# Patient Record
Sex: Male | Born: 1965 | Race: White | Hispanic: No | Marital: Married
Health system: Southern US, Community
[De-identification: ages and names within clinical notes are randomized; demographics above are authoritative.]

---

## 2020-03-01 ENCOUNTER — Other Ambulatory Visit: Payer: Self-pay

## 2020-03-01 ENCOUNTER — Encounter: Payer: Self-pay | Admitting: Pulmonary Disease

## 2020-03-01 ENCOUNTER — Ambulatory Visit (INDEPENDENT_AMBULATORY_CARE_PROVIDER_SITE_OTHER): Payer: BC Managed Care – PPO | Admitting: Pulmonary Disease

## 2020-03-01 VITALS — BP 126/78 | HR 103 | Temp 97.5°F | Ht 77.0 in | Wt 317.8 lb

## 2020-03-01 DIAGNOSIS — R0683 Snoring: Secondary | ICD-10-CM | POA: Diagnosis not present

## 2020-03-01 NOTE — Progress Notes (Signed)
Zachary Herrera    [id number]    08-Apr-1966  Primary Care Physician:Patient, No Pcp Per  Referring Physician: Mattie Marlin, DO [address] [place],  [place] [address]  Chief complaint:   Patient with a history of snoring  HPI:  Long-term history of snoring Snoring is louder recently  Usually goes to bed between 1030 and 11 10 to 15 minutes to fall asleep Sometimes requires a sleep aid to help him sleep-uses NyQuil, did try melatonin in the past Multiple awakenings about 2-3 Final wake up time about 7 AM  Sleep is nonrestorative Usually with dryness in the morning No morning headaches No night sweats Memory is good Able to focus and concentrate without difficulty  Mom does snore and is on CPAP  Reformed smoker, quit in 2016   No outpatient encounter medications on file as of 03/01/2020.   No facility-administered encounter medications on file as of 03/01/2020.    Allergies as of 03/01/2020  . (Not on File)    No past medical history on file.  No family history on file.  Social History   Socioeconomic History  . Marital status: Married    Spouse name: Not on file  . Number of children: Not on file  . Years of education: Not on file  . Highest education level: Not on file  Occupational History  . Not on file  Tobacco Use  . Smoking status: Not on file  Substance and Sexual Activity  . Alcohol use: Not on file  . Drug use: Not on file  . Sexual activity: Not on file  Other Topics Concern  . Not on file  Social History Narrative  . Not on file   Social Determinants of Health   Financial Resource Strain:   . Difficulty of Paying Living Expenses: Not on file  Food Insecurity:   . Worried About Programme researcher, broadcasting/film/video in the Last Year: Not on file  . Ran Out of Food in the Last Year: Not on file  Transportation Needs:   . Lack of Transportation (Medical): Not on file  . Lack of Transportation (Non-Medical): Not on file  Physical  Activity:   . Days of Exercise per Week: Not on file  . Minutes of Exercise per Session: Not on file  Stress:   . Feeling of Stress : Not on file  Social Connections:   . Frequency of Communication with Friends and Family: Not on file  . Frequency of Social Gatherings with Friends and Family: Not on file  . Attends Religious Services: Not on file  . Active Member of Clubs or Organizations: Not on file  . Attends Banker Meetings: Not on file  . Marital Status: Not on file  Intimate Partner Violence:   . Fear of Current or Ex-Partner: Not on file  . Emotionally Abused: Not on file  . Physically Abused: Not on file  . Sexually Abused: Not on file    Review of Systems  Psychiatric/Behavioral: Positive for sleep disturbance.  All other systems reviewed and are negative.   There were no vitals filed for this visit.   Physical Exam Constitutional:      Appearance: He is obese.  HENT:     Nose: No congestion or rhinorrhea.     Mouth/Throat:     Pharynx: No oropharyngeal exudate.  Eyes:     General:        Right eye: No discharge.  Left eye: No discharge.  Musculoskeletal:     Cervical back: No rigidity or tenderness.  Neurological:     Mental Status: He is alert.    Data Reviewed: Referral documents reviewed  Assessment:  Moderate probability of significant obstructive sleep apnea  We will schedule patient for home sleep study  Treatment options discussed  Pathophysiology of sleep disordered breathing discussed   Plan/Recommendations: Risks with not treating sleep disordered breathing discussed  Tentative follow-up in 3 months  Encouraged to call with any significant concerns  Weight loss encouraged   Virl Diamond MD [facility] 03/01/2020, 8:43 AM  CC: Lazoff, Shawn P, DO

## 2020-03-01 NOTE — Patient Instructions (Signed)
Moderate probability of significant obstructive sleep apnea  We will schedule you for home sleep study We will update your results as soon as reviewed  Treatment options as we discussed  Follow-up tentatively in about 3 months  Call with significant concerns Sleep Apnea Sleep apnea affects breathing during sleep. It causes breathing to stop for a short time or to become shallow. It can also increase the risk of:  Heart attack.  Stroke.  Being very overweight (obese).  Diabetes.  Heart failure.  Irregular heartbeat. The goal of treatment is to help you breathe normally again. What are the causes? There are three kinds of sleep apnea:  Obstructive sleep apnea. This is caused by a blocked or collapsed airway.  Central sleep apnea. This happens when the brain does not send the right signals to the muscles that control breathing.  Mixed sleep apnea. This is a combination of obstructive and central sleep apnea. The most common cause of this condition is a collapsed or blocked airway. This can happen if:  Your throat muscles are too relaxed.  Your tongue and tonsils are too large.  You are overweight.  Your airway is too small. What increases the risk?  Being overweight.  Smoking.  Having a small airway.  Being older.  Being male.  Drinking alcohol.  Taking medicines to calm yourself (sedatives or tranquilizers).  Having family members with the condition. What are the signs or symptoms?  Trouble staying asleep.  Being sleepy or tired during the day.  Getting angry a lot.  Loud snoring.  Headaches in the morning.  Not being able to focus your mind (concentrate).  Forgetting things.  Less interest in sex.  Mood swings.  Personality changes.  Feelings of sadness (depression).  Waking up a lot during the night to pee (urinate).  Dry mouth.  Sore throat. How is this diagnosed?  Your medical history.  A physical exam.  A test that is  done when you are sleeping (sleep study). The test is most often done in a sleep lab but may also be done at home. How is this treated?   Sleeping on your side.  Using a medicine to get rid of mucus in your nose (decongestant).  Avoiding the use of alcohol, medicines to help you relax, or certain pain medicines (narcotics).  Losing weight, if needed.  Changing your diet.  Not smoking.  Using a machine to open your airway while you sleep, such as: ? An oral appliance. This is a mouthpiece that shifts your lower jaw forward. ? A CPAP device. This device blows air through a mask when you breathe out (exhale). ? An EPAP device. This has valves that you put in each nostril. ? A BPAP device. This device blows air through a mask when you breathe in (inhale) and breathe out.  Having surgery if other treatments do not work. It is important to get treatment for sleep apnea. Without treatment, it can lead to:  High blood pressure.  Coronary artery disease.  In men, not being able to have an erection (impotence).  Reduced thinking ability. Follow these instructions at home: Lifestyle  Make changes that your doctor recommends.  Eat a healthy diet.  Lose weight if needed.  Avoid alcohol, medicines to help you relax, and some pain medicines.  Do not use any products that contain nicotine or tobacco, such as cigarettes, e-cigarettes, and chewing tobacco. If you need help quitting, ask your doctor. General instructions  Take over-the-counter and prescription medicines  only as told by your doctor.  If you were given a machine to use while you sleep, use it only as told by your doctor.  If you are having surgery, make sure to tell your doctor you have sleep apnea. You may need to bring your device with you.  Keep all follow-up visits as told by your doctor. This is important. Contact a doctor if:  The machine that you were given to use during sleep bothers you or does not seem to  be working.  You do not get better.  You get worse. Get help right away if:  Your chest hurts.  You have trouble breathing in enough air.  You have an uncomfortable feeling in your back, arms, or stomach.  You have trouble talking.  One side of your body feels weak.  A part of your face is hanging down. These symptoms may be an emergency. Do not wait to see if the symptoms will go away. Get medical help right away. Call your local emergency services (911 in the [place] Do not drive yourself to the hospital. Summary  This condition affects breathing during sleep.  The most common cause is a collapsed or blocked airway.  The goal of treatment is to help you breathe normally while you sleep. This information is not intended to replace advice given to you by your health care provider. Make sure you discuss any questions you have with your health care provider. Document Revised: 01/10/2018 Document Reviewed: 11/19/2017 Elsevier Patient Education  [place].

## 2020-04-13 ENCOUNTER — Telehealth: Payer: Self-pay | Admitting: Pulmonary Disease

## 2020-04-13 DIAGNOSIS — R0683 Snoring: Secondary | ICD-10-CM

## 2020-04-13 NOTE — Telephone Encounter (Signed)
Office note indicates home sleep study  Will send to Dr. Wynona Neat for clarification

## 2020-04-13 NOTE — Telephone Encounter (Signed)
Pt calling to schedule HST but I do not see a order

## 2020-04-14 NOTE — Telephone Encounter (Signed)
Ordered placed

## 2020-04-14 NOTE — Telephone Encounter (Signed)
Yes, please kindly schedule home sleep study

## 2020-05-06 ENCOUNTER — Other Ambulatory Visit: Payer: Self-pay

## 2020-05-06 ENCOUNTER — Ambulatory Visit: Payer: BC Managed Care – PPO

## 2020-05-06 DIAGNOSIS — R0683 Snoring: Secondary | ICD-10-CM

## 2020-05-20 ENCOUNTER — Telehealth: Payer: Self-pay | Admitting: Pulmonary Disease

## 2020-05-20 DIAGNOSIS — R0683 Snoring: Secondary | ICD-10-CM

## 2020-05-20 NOTE — Telephone Encounter (Signed)
Called and spoke with patient to let him know of his negative sleep study results. Advised him of the recommendations for oral device he stated that his dentist can do them and he would think about it. Nothing further needed at this time.

## 2020-05-20 NOTE — Telephone Encounter (Signed)
Call patient  Sleep study result  Date of study: 05/08/2020  Impression: Negative study for significant sleep disordered breathing No oxygen desaturations Snoring  Recommendation: Referral to a dentist for an oral device for the treatment of snoring should be considered  Negative study may be a false negative especially in patients with high pretest probability-high likelihood of sleep apnea  A negative study is consistent with you not having significant obstructive sleep apnea  Routine follow-up as needed

## 2021-02-27 ENCOUNTER — Other Ambulatory Visit: Payer: Self-pay | Admitting: Allergy

## 2021-02-27 ENCOUNTER — Other Ambulatory Visit: Payer: Self-pay

## 2021-02-27 ENCOUNTER — Ambulatory Visit
Admission: RE | Admit: 2021-02-27 | Discharge: 2021-02-27 | Disposition: A | Discharge status: Home / Self Care | Payer: BC Managed Care – PPO | Source: Ambulatory Visit | Attending: Allergy | Admitting: Allergy

## 2021-02-27 DIAGNOSIS — R059 Cough, unspecified: Secondary | ICD-10-CM

## 2022-02-22 IMAGING — CR DG CHEST 2V
2 series · 2 of 2 positions shown · non-contrast
Comparison: None.

CLINICAL DATA: Cough and shortness of breath at night for several
months.

EXAM:
CHEST - 2 VIEW

[w chest pa]
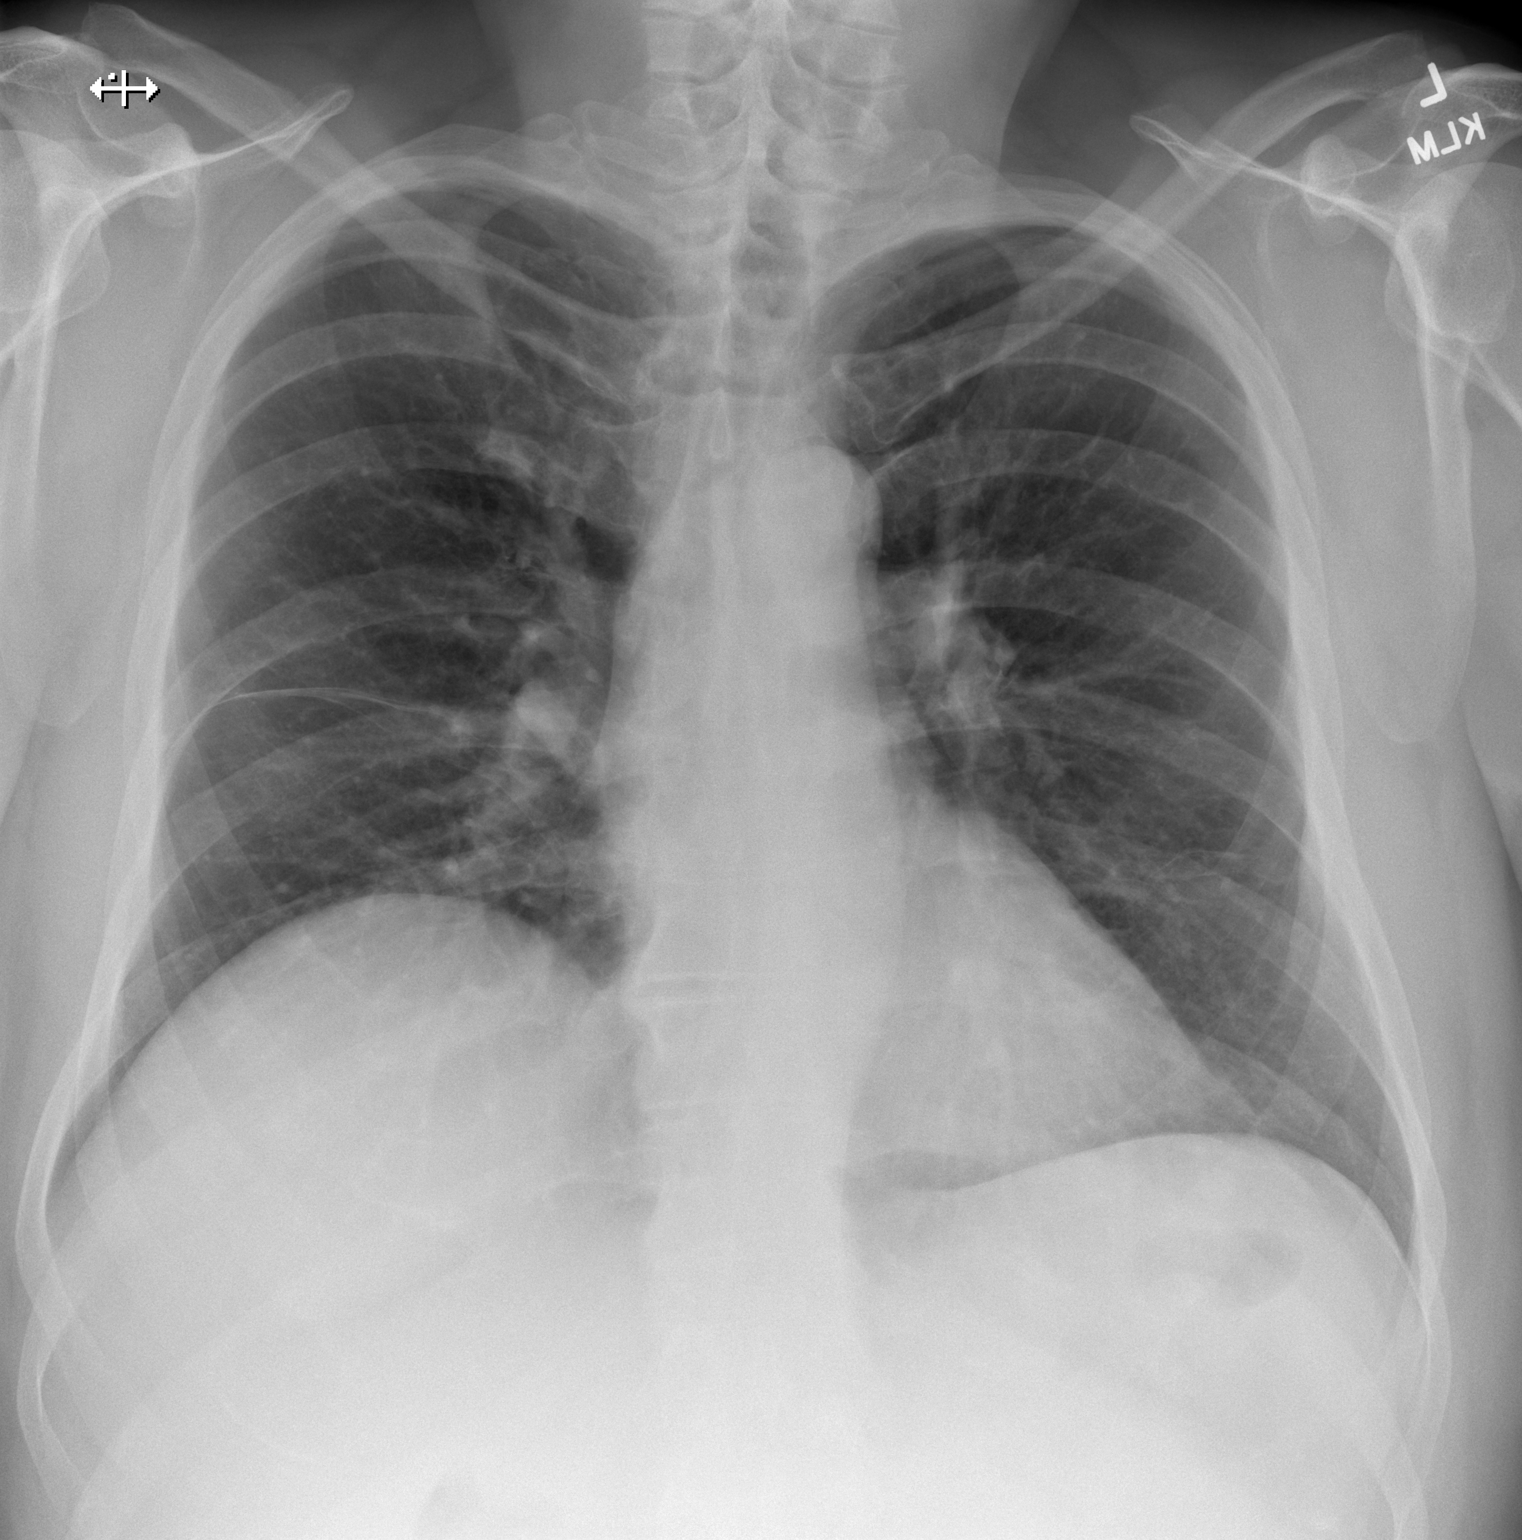

[w chest lat]
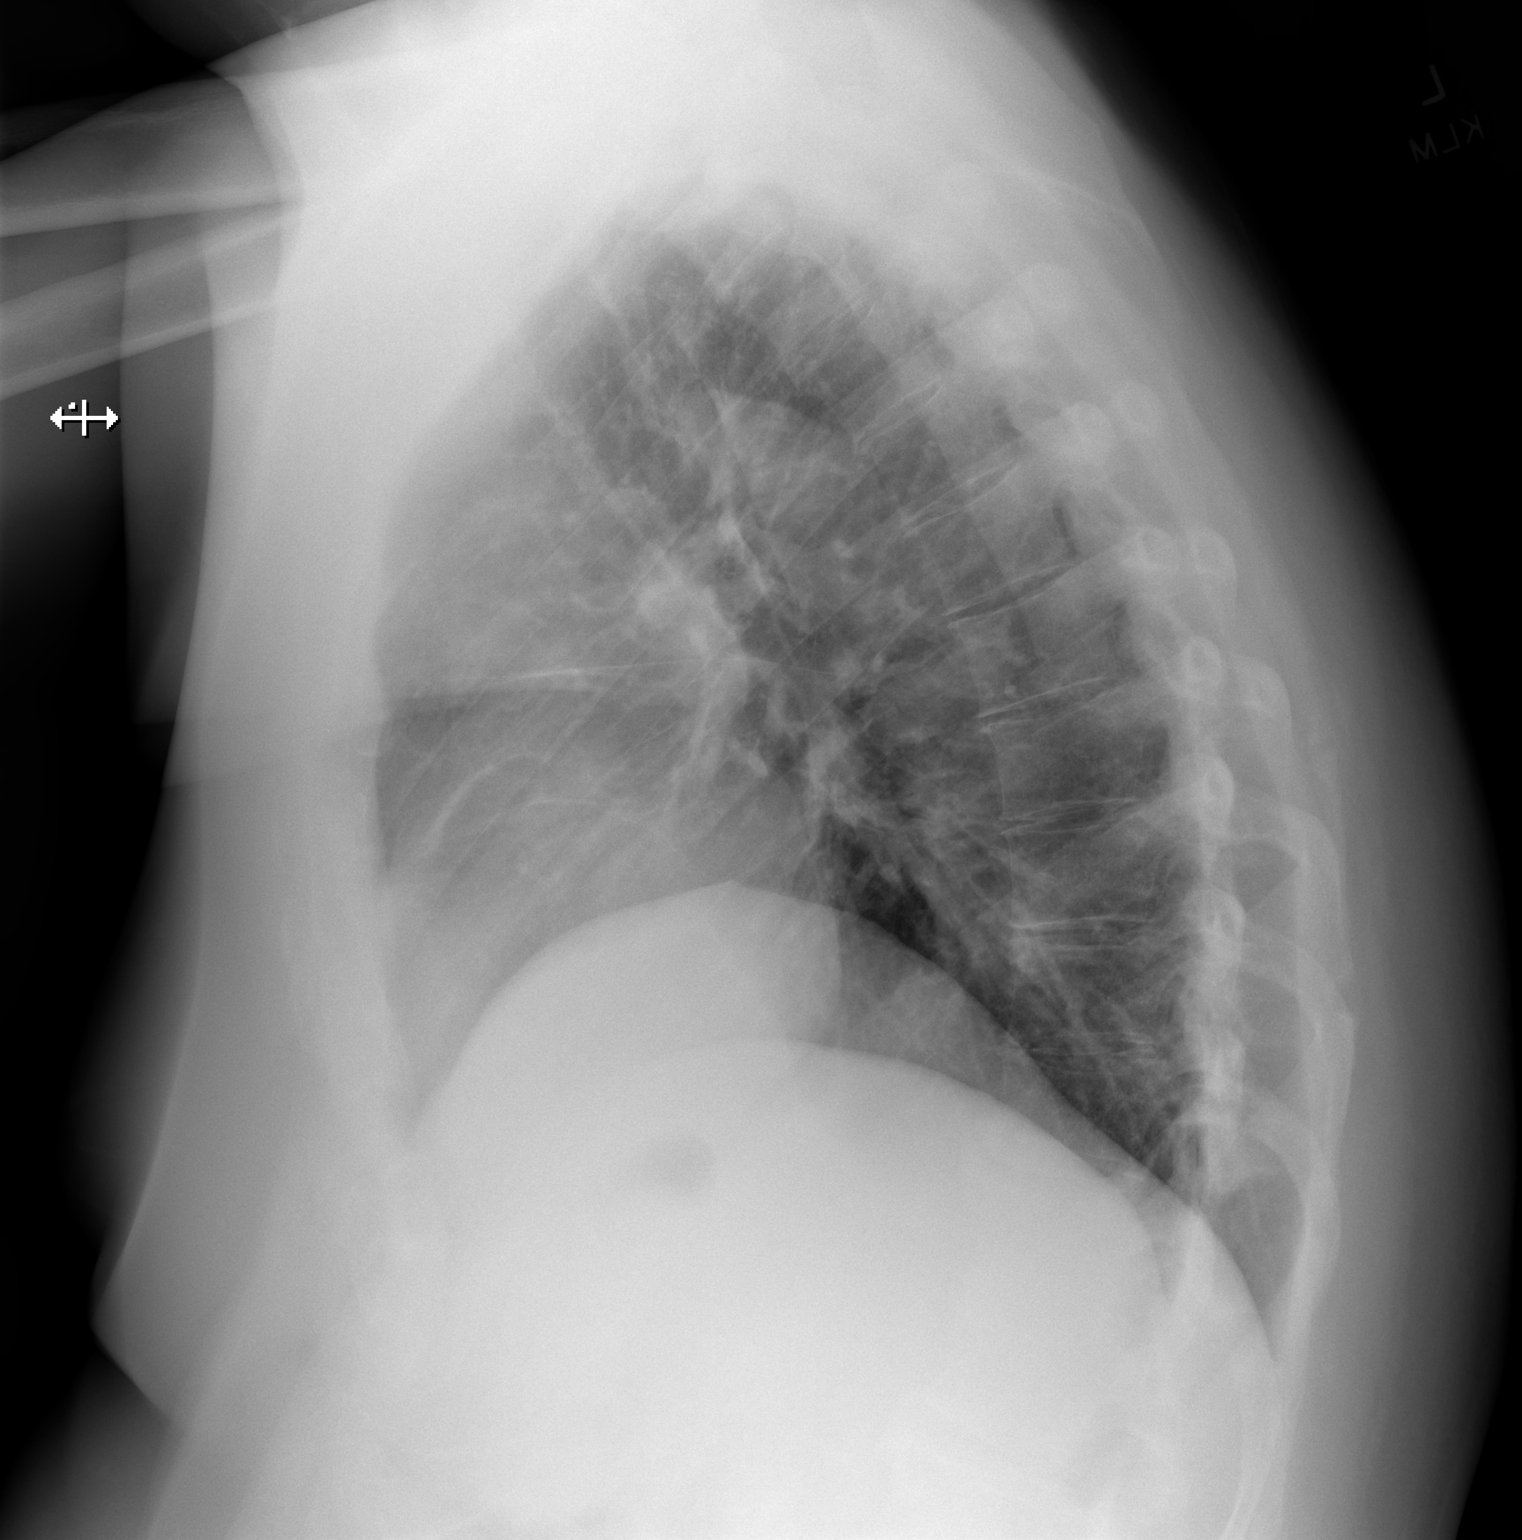

[2 of 2 positions shown; findings below may reference images not displayed]

FINDINGS: Normal heart size. Normal mediastinal contour. No pneumothorax. No
pleural effusion. Mild eventration of the anterior right
hemidiaphragm. No pulmonary edema. No acute consolidative airspace
disease. Minimal curvilinear scarring versus atelectasis at the
anterior right lung base.
IMPRESSION: Minimal curvilinear scarring versus atelectasis at the anterior
right lung base. Mild eventration of the anterior right
hemidiaphragm. Otherwise no active cardiopulmonary disease.
